# Patient Record
Sex: Female | Born: 1950 | Race: White | Hispanic: No | Marital: Married | Smoking: Never smoker
Health system: Southern US, Community
[De-identification: ages and names within clinical notes are randomized; demographics above are authoritative.]

## PROBLEM LIST (undated history)

## (undated) DIAGNOSIS — I10 Essential (primary) hypertension: Secondary | ICD-10-CM

## (undated) DIAGNOSIS — E119 Type 2 diabetes mellitus without complications: Secondary | ICD-10-CM

## (undated) HISTORY — PX: OTHER SURGICAL HISTORY: SHX169

## (undated) HISTORY — PX: CHOLECYSTECTOMY: SHX55

## (undated) HISTORY — PX: ABDOMINAL HYSTERECTOMY: SHX81

## (undated) HISTORY — PX: CARDIAC SURGERY: SHX584

---

## 2017-04-22 ENCOUNTER — Encounter (HOSPITAL_COMMUNITY): Payer: Self-pay | Admitting: Emergency Medicine

## 2017-04-22 ENCOUNTER — Emergency Department (HOSPITAL_COMMUNITY): Payer: BLUE CROSS/BLUE SHIELD

## 2017-04-22 ENCOUNTER — Other Ambulatory Visit: Payer: Self-pay

## 2017-04-22 ENCOUNTER — Emergency Department (HOSPITAL_COMMUNITY)
Admission: EM | Admit: 2017-04-22 | Discharge: 2017-04-23 | Disposition: A | Discharge status: Home / Self Care | Payer: BLUE CROSS/BLUE SHIELD | Attending: Emergency Medicine | Admitting: Emergency Medicine

## 2017-04-22 DIAGNOSIS — W010XXA Fall on same level from slipping, tripping and stumbling without subsequent striking against object, initial encounter: Secondary | ICD-10-CM | POA: Insufficient documentation

## 2017-04-22 DIAGNOSIS — Y9301 Activity, walking, marching and hiking: Secondary | ICD-10-CM | POA: Insufficient documentation

## 2017-04-22 DIAGNOSIS — S7002XA Contusion of left hip, initial encounter: Secondary | ICD-10-CM | POA: Diagnosis not present

## 2017-04-22 DIAGNOSIS — Z7984 Long term (current) use of oral hypoglycemic drugs: Secondary | ICD-10-CM | POA: Diagnosis not present

## 2017-04-22 DIAGNOSIS — I10 Essential (primary) hypertension: Secondary | ICD-10-CM | POA: Diagnosis not present

## 2017-04-22 DIAGNOSIS — Y999 Unspecified external cause status: Secondary | ICD-10-CM | POA: Diagnosis not present

## 2017-04-22 DIAGNOSIS — S0990XA Unspecified injury of head, initial encounter: Secondary | ICD-10-CM

## 2017-04-22 DIAGNOSIS — Y9289 Other specified places as the place of occurrence of the external cause: Secondary | ICD-10-CM | POA: Insufficient documentation

## 2017-04-22 DIAGNOSIS — Z79899 Other long term (current) drug therapy: Secondary | ICD-10-CM | POA: Diagnosis not present

## 2017-04-22 DIAGNOSIS — Z7982 Long term (current) use of aspirin: Secondary | ICD-10-CM | POA: Insufficient documentation

## 2017-04-22 DIAGNOSIS — S098XXA Other specified injuries of head, initial encounter: Secondary | ICD-10-CM | POA: Diagnosis not present

## 2017-04-22 DIAGNOSIS — M25512 Pain in left shoulder: Secondary | ICD-10-CM | POA: Diagnosis not present

## 2017-04-22 DIAGNOSIS — M25562 Pain in left knee: Secondary | ICD-10-CM | POA: Insufficient documentation

## 2017-04-22 DIAGNOSIS — E119 Type 2 diabetes mellitus without complications: Secondary | ICD-10-CM | POA: Diagnosis not present

## 2017-04-22 DIAGNOSIS — M542 Cervicalgia: Secondary | ICD-10-CM | POA: Insufficient documentation

## 2017-04-22 DIAGNOSIS — W19XXXA Unspecified fall, initial encounter: Secondary | ICD-10-CM

## 2017-04-22 HISTORY — DX: Essential (primary) hypertension: I10

## 2017-04-22 HISTORY — DX: Type 2 diabetes mellitus without complications: E11.9

## 2017-04-22 NOTE — ED Triage Notes (Signed)
Pt BIB [facility], pt slipped and fell hitting her left side. Pt c/o left hip, shoulder, and neck pain. No shortning or rotation noted to left leg. Pt is able to move leg without severe pain. Pt reports hitting her head but denies any LOC. 50 mcg fentanyl given by EMS pain 6/10 initially, now 3/10.

## 2017-04-22 NOTE — ED Notes (Signed)
Bed: [id number] Expected date:  Expected time:  Means of arrival:  Comments: [place] and hip pain

## 2017-04-22 NOTE — ED Provider Notes (Signed)
[facility]-EMERGENCY DEPT Provider Note   CSN: [id number] Arrival date & time: 04/22/17  2021     History   Chief Complaint Chief Complaint  Patient presents with  . Fall    HPI Kristy KochJanet West is a 67 y.o. female.  Patient is a 67 year old female who presents after a fall.  [facility] [place] [facility] [place] [place] and slipped on what she thinks was some water while she was [place] [employer].  She fell over into her left side.  She has pain in her shoulder left hip and left knee.  She did hit her head.  There is no loss of consciousness.  She is not on anticoagulants.  She does have a left-sided headache.  She also has some neck pain.  No numbness or weakness to her extremities.  She denies any other injuries.  She denies any dizziness chest pain or shortness of breath prior to the fall.  She states that she just slipped on something on the floor which she thinks was water.     Past Medical History:  Diagnosis Date  . Diabetes mellitus without complication (HCC)   . Hypertension     There are no active problems to display for this patient.   Past Surgical History:  Procedure Laterality Date  .  left Rotator Cuff surgery    . ABDOMINAL HYSTERECTOMY    . CARDIAC SURGERY    . CHOLECYSTECTOMY       OB History   None      Home Medications    Prior to Admission medications   Medication Sig Start Date End Date Taking? Authorizing Provider  aspirin EC 81 MG tablet Take 81 mg by mouth every evening.   Yes [provider]  atorvastatin (LIPITOR) 40 MG tablet Take 40 mg by mouth every evening.   Yes [provider]  chlorthalidone (HYGROTON) 25 MG tablet Take 12.5 mg by mouth daily.   Yes [provider]  citalopram (CELEXA) 40 MG tablet Take 40 mg by mouth at bedtime.   Yes [provider]  metFORMIN (GLUCOPHAGE) 500 MG tablet Take 1,000 mg by mouth 2 (two) times daily with a meal.   Yes [provider]  metoprolol succinate (TOPROL-XL) 25 MG 24 hr tablet Take 25 mg by mouth daily.   Yes [provider]  ranolazine (RANEXA) 500 MG 12 hr tablet Take 500 mg by mouth daily.   Yes [provider]    Family History History reviewed. No pertinent family history.  Social History Social History   Tobacco Use  . Smoking status: Never Smoker  . Smokeless tobacco: Never Used  Substance Use Topics  . Alcohol use: Never    Frequency: Never  . Drug use: Never     Allergies   Patient has no known allergies.   Review of Systems Review of Systems  Constitutional: Negative for activity change, appetite change and fever.  HENT: Negative for dental problem, nosebleeds and trouble swallowing.   Eyes: Negative for pain and visual disturbance.  Respiratory: Negative for shortness of breath.   Cardiovascular: Negative for chest pain.  Gastrointestinal: Negative for abdominal pain, nausea and vomiting.  Genitourinary: Negative for dysuria and hematuria.  Musculoskeletal: Positive for arthralgias and neck pain. Negative for back pain and joint swelling.  Skin: Negative for wound.  Neurological: Positive for headaches. Negative for weakness and numbness.  Psychiatric/Behavioral: Negative for confusion.     Physical Exam Updated  Vital Signs BP 127/69   Pulse 81   Temp 98 F (36.7 C) (Oral)   Resp 17   Ht 5' 1.5" (1.562 m)   Wt 79.4 kg (175 lb)   SpO2 99%   BMI 32.53 kg/m   Physical Exam  Constitutional: She is oriented to person, place, and time. She appears well-developed and well-nourished.  HENT:  Head: Normocephalic and atraumatic.  Nose: Nose normal.  No hemotympanum  Eyes: Pupils are equal, round, and reactive to light. Conjunctivae are normal.  Neck:  C-collar is in place.  Patient has tenderness to the mid and lower cervical spine.  There is no pain to the thoracic or lumbosacral spine. no step-offs or deformities noted  Cardiovascular:  Normal rate and regular rhythm.  No murmur heard. No evidence of external trauma to the chest or abdomen  Pulmonary/Chest: Effort normal and breath sounds normal. No respiratory distress. She has no wheezes. She exhibits no tenderness.  Abdominal: Soft. Bowel sounds are normal. She exhibits no distension. There is no tenderness.  Musculoskeletal: Normal range of motion.  Patient has tenderness on palpation of her left shoulder, her left posterior hip and her left lateral knee.  There is no other pain on palpation or range of motion of the extremities.  There is no joint effusion at the knee.  There is no gross ligament instability.  There is no pain to the ankle.  Pedal pulses are intact.  Neurological: She is alert and oriented to person, place, and time.  Motor 5 out of 5 all extremities, sensation grossly intact light touch all extremities  Skin: Skin is warm and dry. Capillary refill takes less than 2 seconds.  Psychiatric: She has a normal mood and affect.  Vitals reviewed.    ED Treatments / Results  Labs (all labs ordered are listed, but only abnormal results are displayed) Labs Reviewed - No data to display  EKG None  Radiology Ct Head Wo Contrast  Result Date: 04/22/2017 CLINICAL DATA:  Slip and fall injury striking the left side. Left neck pain. EXAM: CT HEAD WITHOUT CONTRAST CT CERVICAL SPINE WITHOUT CONTRAST TECHNIQUE: Multidetector CT imaging of the head and cervical spine was performed following the standard protocol without intravenous contrast. Multiplanar CT image reconstructions of the cervical spine were also generated. COMPARISON:  None. FINDINGS: CT HEAD FINDINGS Brain: Mild diffuse cerebral atrophy. No mass effect or midline shift. No abnormal extra-axial fluid collections. Gray-white matter junctions are distinct. Basal cisterns are not effaced. No acute intracranial hemorrhage. Vascular: Intracranial arterial vascular calcifications are present. Skull: Calvarium  appears intact. No acute depressed skull fractures. Sinuses/Orbits: Mucosal thickening in the sphenoid sinuses. No acute air-fluid levels. Mastoid air cells are clear. Other: None. CT CERVICAL SPINE FINDINGS Alignment: Normal alignment of the cervical vertebrae and facet joints. C1-2 articulation appears intact. Skull base and vertebrae: Skull base appears intact. No vertebral compression deformities. No focal bone lesion or bone destruction. Soft tissues and spinal canal: No prevertebral soft tissue swelling. No paraspinal soft tissue mass or infiltration. Disc levels: Degenerative changes with disc space narrowing and endplate hypertrophic changes most prominent at C4-5, C5-6, and C6-7 levels. Upper chest: Lung apices are clear. Other: None. IMPRESSION: 1. No acute intracranial abnormalities. 2. Normal alignment of the cervical spine. No acute displaced fractures identified. Degenerative changes. Electronically Signed   By: Burman Nieves M.D.   On: 04/22/2017 23:55   Ct Cervical Spine Wo Contrast  Result Date: 04/22/2017 CLINICAL DATA:  Slip and fall  injury striking the left side. Left neck pain. EXAM: CT HEAD WITHOUT CONTRAST CT CERVICAL SPINE WITHOUT CONTRAST TECHNIQUE: Multidetector CT imaging of the head and cervical spine was performed following the standard protocol without intravenous contrast. Multiplanar CT image reconstructions of the cervical spine were also generated. COMPARISON:  None. FINDINGS: CT HEAD FINDINGS Brain: Mild diffuse cerebral atrophy. No mass effect or midline shift. No abnormal extra-axial fluid collections. Gray-white matter junctions are distinct. Basal cisterns are not effaced. No acute intracranial hemorrhage. Vascular: Intracranial arterial vascular calcifications are present. Skull: Calvarium appears intact. No acute depressed skull fractures. Sinuses/Orbits: Mucosal thickening in the sphenoid sinuses. No acute air-fluid levels. Mastoid air cells are clear. Other: None. CT  CERVICAL SPINE FINDINGS Alignment: Normal alignment of the cervical vertebrae and facet joints. C1-2 articulation appears intact. Skull base and vertebrae: Skull base appears intact. No vertebral compression deformities. No focal bone lesion or bone destruction. Soft tissues and spinal canal: No prevertebral soft tissue swelling. No paraspinal soft tissue mass or infiltration. Disc levels: Degenerative changes with disc space narrowing and endplate hypertrophic changes most prominent at C4-5, C5-6, and C6-7 levels. Upper chest: Lung apices are clear. Other: None. IMPRESSION: 1. No acute intracranial abnormalities. 2. Normal alignment of the cervical spine. No acute displaced fractures identified. Degenerative changes. Electronically Signed   By: Burman Nieves M.D.   On: 04/22/2017 23:55   Dg Shoulder Left  Result Date: 04/23/2017 CLINICAL DATA:  Left shoulder pain after slip and fall. EXAM: LEFT SHOULDER - 2+ VIEW COMPARISON:  None. FINDINGS: There is no evidence of fracture or dislocation. There is no evidence of arthropathy or other focal bone abnormality. Soft tissues are unremarkable. IMPRESSION: Negative. Electronically Signed   By: Tollie Eth M.D.   On: 04/23/2017 00:39   Dg Knee Complete 4 Views Left  Result Date: 04/23/2017 CLINICAL DATA:  Left knee pain after slip and fall. EXAM: LEFT KNEE - COMPLETE 4+ VIEW COMPARISON:  None. FINDINGS: No evidence of fracture, dislocation, or joint effusion. No evidence of arthropathy or other focal bone abnormality. Small phleboliths are noted along the medial aspect of the thigh. IMPRESSION: No acute fracture or malalignment of the left knee. No joint effusion. Electronically Signed   By: Tollie Eth M.D.   On: 04/23/2017 00:40   Dg Hip Unilat W Or Wo Pelvis 2-3 Views Left  Result Date: 04/23/2017 CLINICAL DATA:  Patient slipped and fell onto left side. Left hip pain. EXAM: DG HIP (WITH OR WITHOUT PELVIS) 2-3V LEFT COMPARISON:  None. FINDINGS: Lumbar  degenerative disc disease from L4 through S1 with associated facet arthropathy. The bony pelvis appears intact. On the AP view of the left hip, there is superimposition of the coccyx over the superior pubic ramus simulating a fracture as well as a skin fold artifact through the inferior left pubic ramus. The pubic rami appear intact on the AP view of the pelvis bilaterally. There is no evidence of hip fracture or dislocation. There is no evidence of arthropathy or other focal bone abnormality. IMPRESSION: Lower lumbar degenerative disc disease. No acute pelvic or hip fracture. Electronically Signed   By: Tollie Eth M.D.   On: 04/23/2017 00:38    Procedures Procedures (including critical care time)  Medications Ordered in ED Medications - No data to display   Initial Impression / Assessment and Plan / ED Course  I have reviewed the triage vital signs and the nursing notes.  Pertinent labs & imaging results that were available during my  care of the patient were reviewed by me and considered in my medical decision making (see chart for details).     Patient is a 67 year old female who presents after mechanical fall.  She has no evidence of fractures on imaging studies.  No evidence of intracranial hemorrhage.  No spinal injuries noted.  She is able to ambulate on the leg so I doubt she has an occult fracture.  She is neurologically intact.  She was discharged home in good condition.  She denies any for any pain medication.  She was advised to follow-up with her PCP.  Return precautions were given.  Final Clinical Impressions(s) / ED Diagnoses   Final diagnoses:  Fall, initial encounter  Minor head injury, initial encounter  Contusion of left hip, initial encounter    ED Discharge Orders    None       Rolan Bucco, MD 04/23/17 [phone]

## 2017-04-23 MED ORDER — ACETAMINOPHEN 500 MG PO TABS
1000.0000 mg | ORAL_TABLET | Freq: Once | ORAL | Status: AC
  Administered 2017-04-23: 1000 mg via ORAL
  Filled 2017-04-23: qty 2

## 2019-12-06 IMAGING — CR DG KNEE COMPLETE 4+V*L*
4 series · 4 of 4 positions shown · non-contrast
Comparison: None.

CLINICAL DATA: Left knee pain after slip and fall.

EXAM:
LEFT KNEE - COMPLETE 4+ VIEW

[x knee ap left (1 of 3)]
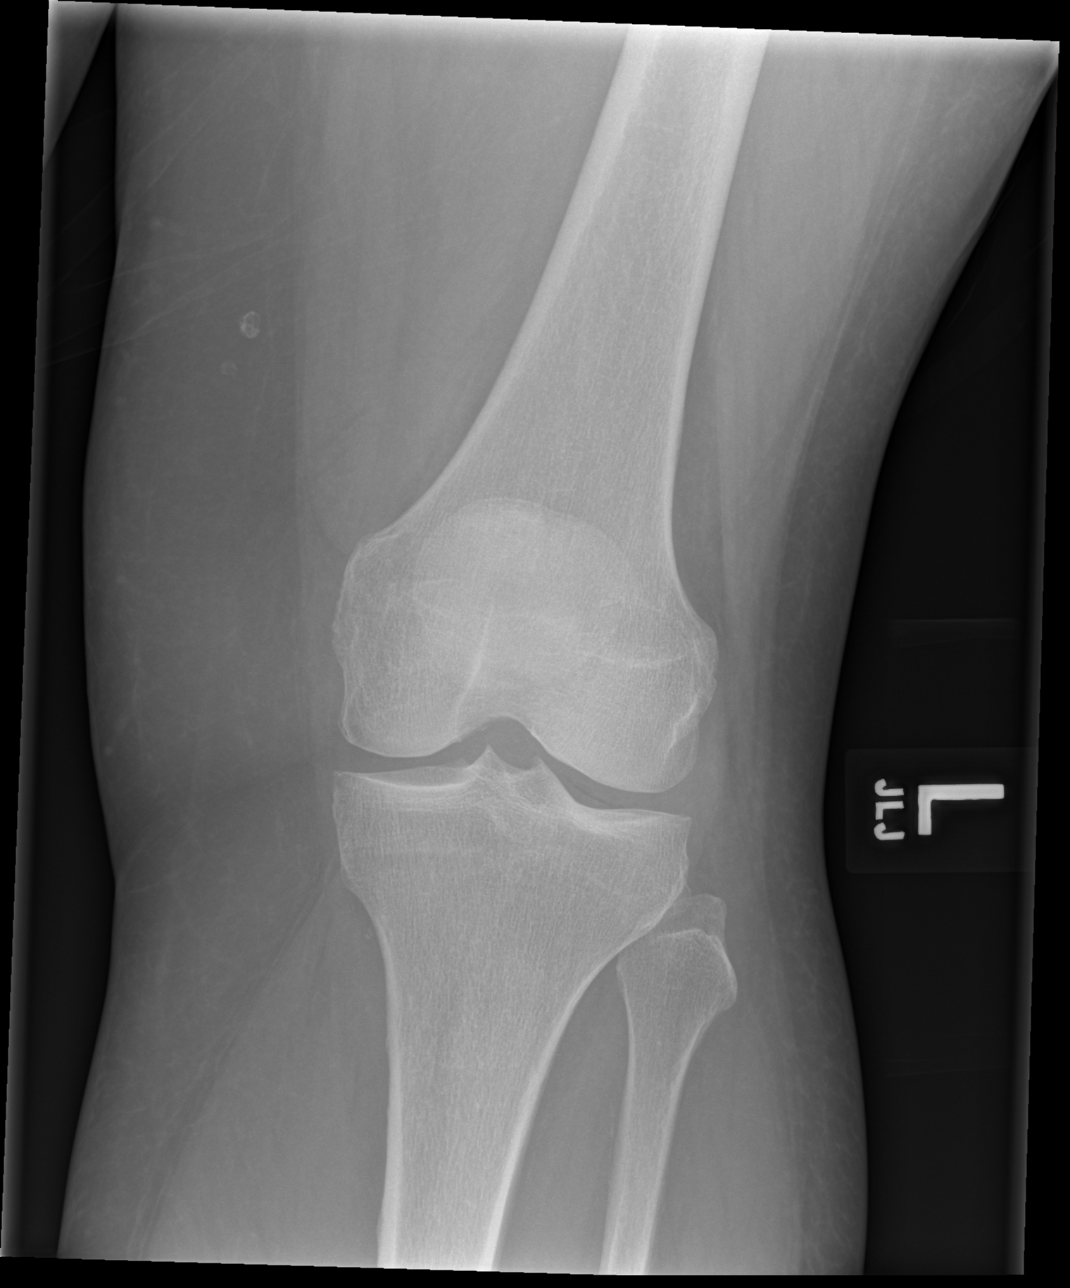

[x knee ap left (2 of 3)]
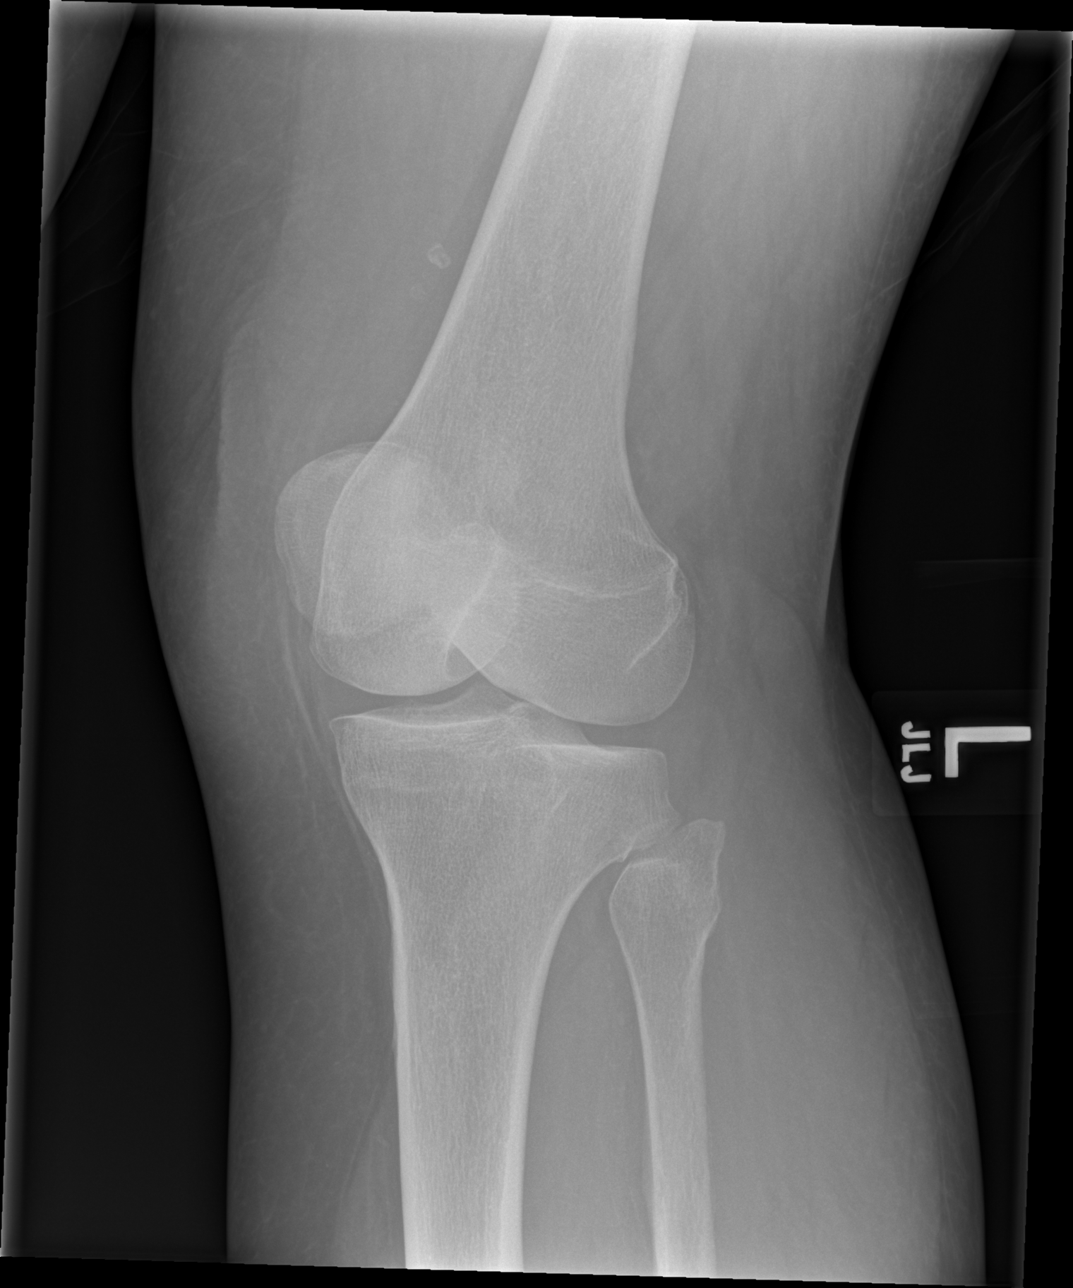

[x knee ap left (3 of 3)]
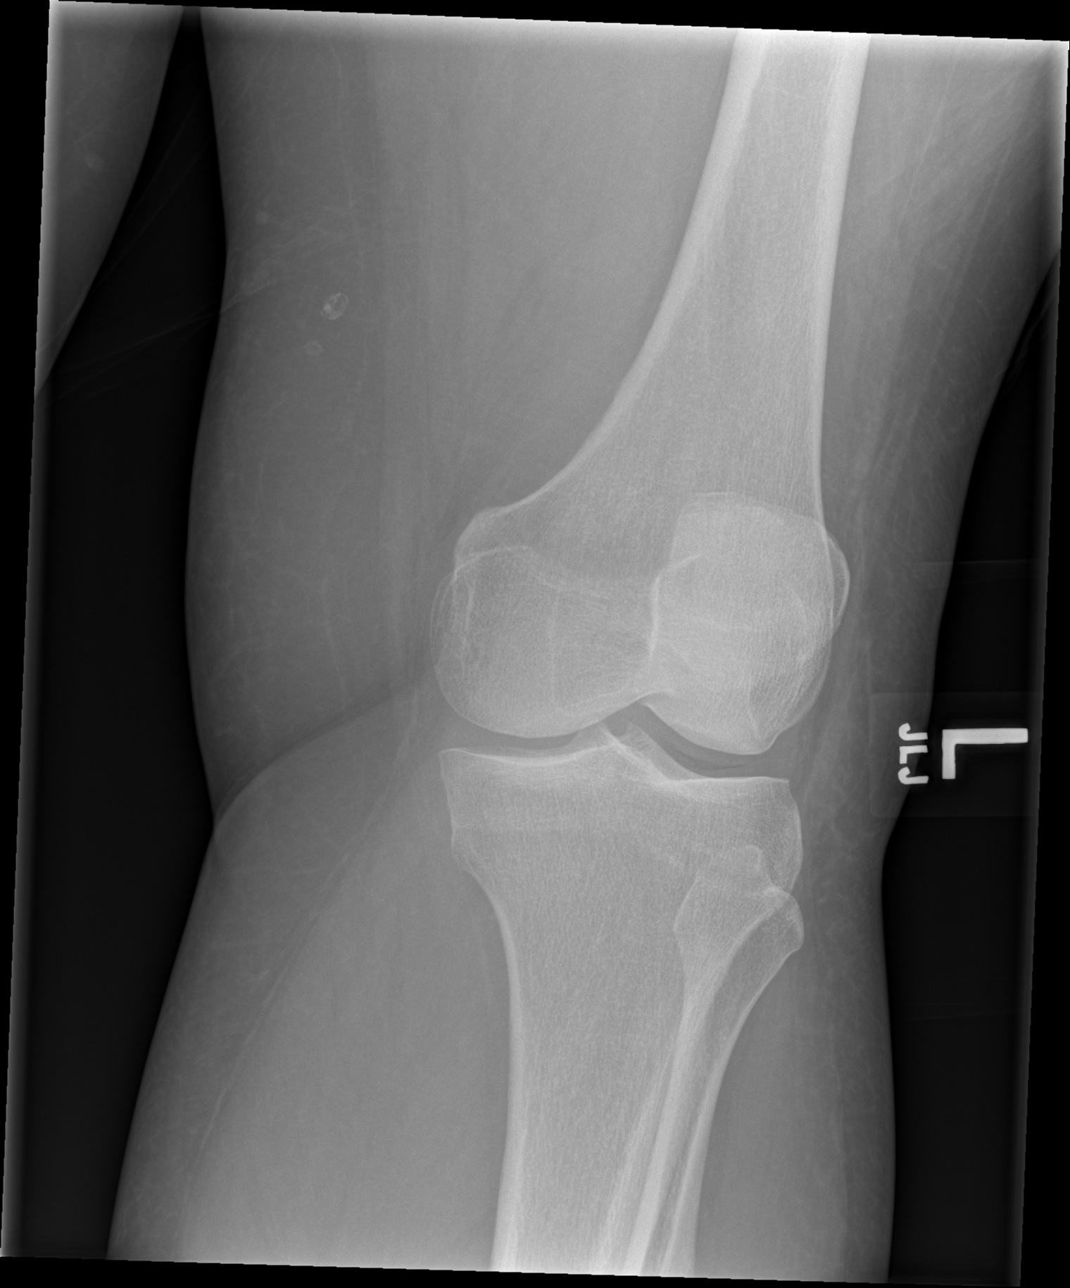

[x knee lat left]
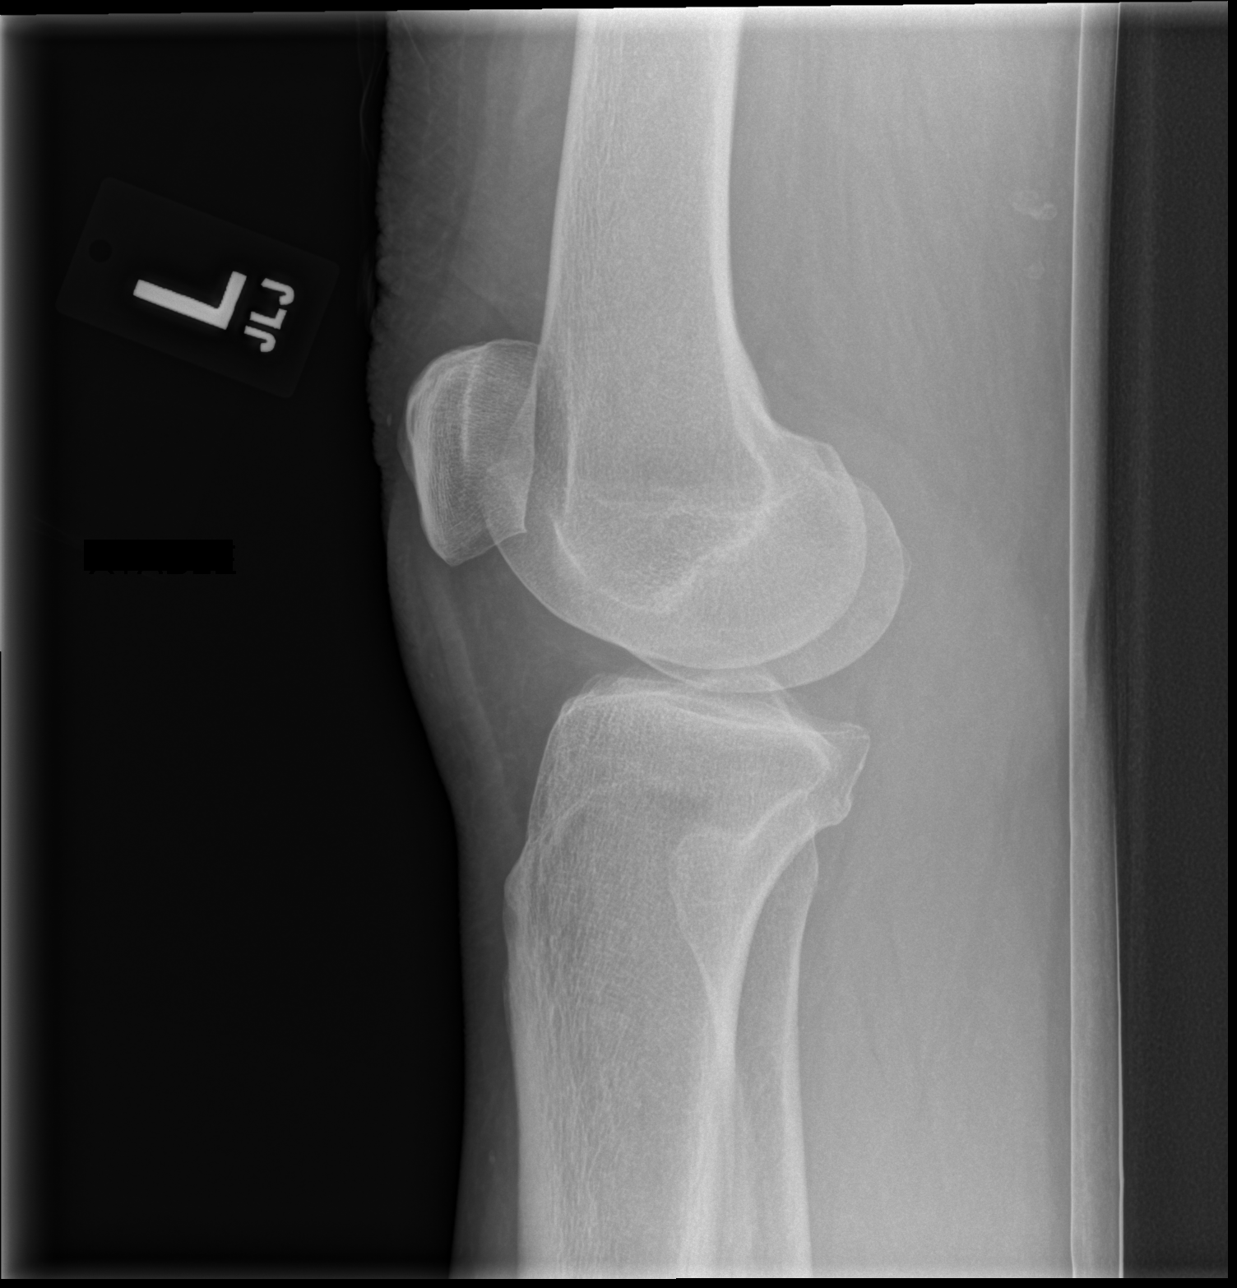

[4 of 4 positions shown; findings below may reference images not displayed]

FINDINGS: No evidence of fracture, dislocation, or joint effusion. No evidence
of arthropathy or other focal bone abnormality. Small phleboliths
are noted along the medial aspect of the thigh.
IMPRESSION: No acute fracture or malalignment of the left knee. No joint
effusion.
